# Patient Record
Sex: Male | Born: 1975 | Race: Black or African American | Hispanic: No | Marital: Single | Smoking: Never smoker
Health system: Southern US, Community
[De-identification: ages and names within clinical notes are randomized; demographics above are authoritative.]

---

## 2016-05-18 ENCOUNTER — Emergency Department (HOSPITAL_COMMUNITY)
Admission: EM | Admit: 2016-05-18 | Discharge: 2016-05-18 | Disposition: A | Discharge status: Home / Self Care | Payer: No Typology Code available for payment source | Attending: Emergency Medicine | Admitting: Emergency Medicine

## 2016-05-18 ENCOUNTER — Emergency Department (HOSPITAL_COMMUNITY): Payer: No Typology Code available for payment source

## 2016-05-18 ENCOUNTER — Encounter (HOSPITAL_COMMUNITY): Payer: Self-pay | Admitting: Emergency Medicine

## 2016-05-18 DIAGNOSIS — Y92410 Unspecified street and highway as the place of occurrence of the external cause: Secondary | ICD-10-CM | POA: Diagnosis not present

## 2016-05-18 DIAGNOSIS — Y939 Activity, unspecified: Secondary | ICD-10-CM | POA: Diagnosis not present

## 2016-05-18 DIAGNOSIS — S2231XA Fracture of one rib, right side, initial encounter for closed fracture: Secondary | ICD-10-CM | POA: Diagnosis not present

## 2016-05-18 DIAGNOSIS — Y999 Unspecified external cause status: Secondary | ICD-10-CM | POA: Diagnosis not present

## 2016-05-18 DIAGNOSIS — S299XXA Unspecified injury of thorax, initial encounter: Secondary | ICD-10-CM | POA: Diagnosis present

## 2016-05-18 MED ORDER — METHOCARBAMOL 500 MG PO TABS: ORAL_TABLET | ORAL | 0 refills | Status: DC

## 2016-05-18 MED ORDER — IBUPROFEN 800 MG PO TABS
800.0000 mg | ORAL_TABLET | Freq: Once | ORAL | Status: AC
  Administered 2016-05-18: 800 mg via ORAL
  Filled 2016-05-18: qty 4

## 2016-05-18 MED ORDER — HYDROCODONE-ACETAMINOPHEN 5-325 MG PO TABS: ORAL_TABLET | ORAL | 0 refills | Status: DC
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
## 2016-05-18 NOTE — Discharge Instructions (Signed)
Take percocet for breakthrough pain, do not drink alcohol, drive, care for children or do other critical tasks while taking percocet.  ° °It is very important that you take deep breaths to prevent lung collapse and infection. ° °Either use your incentive spirometer or take 10 deep breaths every hour to prevent lung collapse. ° °If you develop cough, fever or shortness of breath return immediately to the emergency room.  ° ° °Please follow with your primary care doctor in the next 2 days for a check-up. They must obtain records for further management.  ° °Do not hesitate to return to the Emergency Department for any new, worsening or concerning symptoms.  ° °

## 2016-05-18 NOTE — ED Notes (Signed)
RT at bedside. Patient education on incentive spirometry. Pt verbalizes and demonstrates understanding.
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
## 2016-05-23 ENCOUNTER — Ambulatory Visit (INDEPENDENT_AMBULATORY_CARE_PROVIDER_SITE_OTHER): Payer: Managed Care, Other (non HMO) | Admitting: Emergency Medicine

## 2016-05-23 ENCOUNTER — Ambulatory Visit (INDEPENDENT_AMBULATORY_CARE_PROVIDER_SITE_OTHER): Payer: Managed Care, Other (non HMO)

## 2016-05-23 VITALS — BP 114/62 | HR 60 | Temp 98.6°F | Resp 16 | Ht 73.0 in | Wt 209.0 lb

## 2016-05-23 DIAGNOSIS — S2231XD Fracture of one rib, right side, subsequent encounter for fracture with routine healing: Secondary | ICD-10-CM

## 2016-05-23 DIAGNOSIS — S301XXA Contusion of abdominal wall, initial encounter: Secondary | ICD-10-CM | POA: Diagnosis not present

## 2016-05-23 LAB — POCT URINALYSIS DIP (MANUAL ENTRY)
BILIRUBIN UA: NEGATIVE
GLUCOSE UA: NEGATIVE
Ketones, POC UA: NEGATIVE
Leukocytes, UA: NEGATIVE
NITRITE UA: NEGATIVE
Protein Ur, POC: NEGATIVE
RBC UA: NEGATIVE
Spec Grav, UA: 1.02
Urobilinogen, UA: 0.2
pH, UA: 6
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
## 2016-05-23 NOTE — Patient Instructions (Addendum)
IF you received an x-ray today, you will receive an invoice from [facility]. Please contact [facility] at [phone] with questions or concerns regarding your invoice.   IF you received labwork today, you will receive an invoice from [employer]. Please contact [facility] at [phone] with questions or concerns regarding your invoice.   Our billing staff will not be able to assist you with questions regarding bills from these companies.  You will be contacted with the lab results as soon as they are available. The fastest way to get your results is to activate your My Chart account. Instructions are located on the last page of this paperwork. If you have not heard from [place] regarding the results in 2 weeks, please contact this office.     Rib fracture Rib Fracture A rib fracture is a break or crack in one of the bones of the ribs. The ribs are a group of long, curved bones that wrap around your chest and attach to your spine. They protect your lungs and other organs in the chest cavity. A broken or cracked rib is often painful, but most do not cause other problems. Most rib fractures heal on their own over time. However, rib fractures can be more serious if multiple ribs are broken or if broken ribs move out of place and push against other structures. What are the causes?  A direct blow to the chest. For example, this could happen during contact sports, a car accident, or a fall against a hard object.  Repetitive movements with high force, such as pitching a baseball or having severe coughing spells. What are the signs or symptoms?  Pain when you breathe in or cough.  Pain when someone presses on the injured area. How is this diagnosed? Your caregiver will perform a physical exam. Various imaging tests may be ordered to confirm the diagnosis and to look for related injuries. These tests may include a chest X-ray, computed tomography (CT),  magnetic resonance imaging (MRI), or a bone scan. How is this treated? Rib fractures usually heal on their own in 1-3 months. The longer healing period is often associated with a continued cough or other aggravating activities. During the healing period, pain control is very important. Medication is usually given to control pain. Hospitalization or surgery may be needed for more severe injuries, such as those in which multiple ribs are broken or the ribs have moved out of place. Follow these instructions at home:  Avoid strenuous activity and any activities or movements that cause pain. Be careful during activities and avoid bumping the injured rib.  Gradually increase activity as directed by your caregiver.  Only take over-the-counter or prescription medications as directed by your caregiver. Do not take other medications without asking your caregiver first.  Apply ice to the injured area for the first 1-2 days after you have been treated or as directed by your caregiver. Applying ice helps to reduce inflammation and pain.  Put ice in a plastic bag.  Place a towel between your skin and the bag.  Leave the ice on for 15-20 minutes at a time, every 2 hours while you are awake.  Perform deep breathing as directed by your caregiver. This will help prevent pneumonia, which is a common complication of a broken rib. Your caregiver may instruct you to:  Take deep breaths several times a day.  Try to cough several times a day, holding a pillow against the injured area.  Use a device called an incentive spirometer to practice deep breathing several times a day.  Drink enough fluids to keep your urine clear or pale yellow. This will help you avoid constipation.  Do not wear a rib belt or binder. These restrict breathing, which can lead to pneumonia. Get help right away if:  You have a fever.  You have difficulty breathing or shortness of breath.  You develop a continual cough, or you cough  up thick or bloody sputum.  You feel sick to your stomach (nausea), throw up (vomit), or have abdominal pain.  You have worsening pain not controlled with medications. This information is not intended to replace advice given to you by your health care provider. Make sure you discuss any questions you have with your health care provider. Document Released: 06/18/2005 Document Revised: 11/24/2015 Document Reviewed: 08/20/2012 Elsevier Interactive Patient Education  2017 [place]. Lipoma Introduction A lipoma is a noncancerous (benign) tumor that is made up of fat cells. This is a very common type of soft-tissue growth. Lipomas are usually found under the skin (subcutaneous). They may occur in any tissue of the body that contains fat. Common areas for lipomas to appear include the back, shoulders, buttocks, and thighs. Lipomas grow slowly, and they are usually painless. Most lipomas do not cause problems and do not require treatment. What are the causes? The cause of this condition is not known. What increases the risk? This condition is more likely to develop in:  People who are 13-48 years old.  People who have a family history of lipomas. What are the signs or symptoms? A lipoma usually appears as a small, round bump under the skin. It may feel soft or rubbery, but the firmness can vary. Most lipomas are not painful. However, a lipoma may become painful if it is located in an area where it pushes on nerves. How is this diagnosed? A lipoma can usually be diagnosed with a physical exam. You may also have tests to confirm the diagnosis and to rule out other conditions. Tests may include:  Imaging tests, such as a CT scan or MRI.  Removal of a tissue sample to be looked at under a microscope (biopsy). How is this treated? Treatment is not needed for small lipomas that are not causing problems. If a lipoma continues to get bigger or it causes problems, removal is often the best option.  Lipomas can also be removed to improve appearance. Removal of a lipoma is usually done with a surgery in which the fatty cells and the surrounding capsule are removed. Most often, a medicine that numbs the area (local anesthetic) is used for this procedure. Follow these instructions at home:  Keep all follow-up visits as directed by your health care provider. This is important. Contact a health care provider if:  Your lipoma becomes larger or hard.  Your lipoma becomes painful, red, or increasingly swollen. These could be signs of infection or a more serious condition. This information is not intended to replace advice given to you by your health care provider. Make sure you discuss any questions you have with your health care provider. Document Released: 06/08/2002 Document Revised: 11/24/2015 Document Reviewed: 06/14/2014  2017 Elsevier

## 2016-05-23 NOTE — Progress Notes (Signed)
Yellow °Clear ° °

## 2017-04-25 ENCOUNTER — Emergency Department (HOSPITAL_COMMUNITY): Payer: Managed Care, Other (non HMO)

## 2017-04-25 ENCOUNTER — Encounter (HOSPITAL_COMMUNITY): Payer: Self-pay

## 2017-04-25 ENCOUNTER — Emergency Department (HOSPITAL_COMMUNITY)
Admission: EM | Admit: 2017-04-25 | Discharge: 2017-04-25 | Disposition: A | Discharge status: Home / Self Care | Payer: Managed Care, Other (non HMO) | Attending: Emergency Medicine | Admitting: Emergency Medicine

## 2017-04-25 DIAGNOSIS — S199XXA Unspecified injury of neck, initial encounter: Secondary | ICD-10-CM | POA: Diagnosis present

## 2017-04-25 DIAGNOSIS — Y999 Unspecified external cause status: Secondary | ICD-10-CM | POA: Insufficient documentation

## 2017-04-25 DIAGNOSIS — Y939 Activity, unspecified: Secondary | ICD-10-CM | POA: Insufficient documentation

## 2017-04-25 DIAGNOSIS — S161XXA Strain of muscle, fascia and tendon at neck level, initial encounter: Secondary | ICD-10-CM | POA: Insufficient documentation

## 2017-04-25 DIAGNOSIS — S0081XA Abrasion of other part of head, initial encounter: Secondary | ICD-10-CM | POA: Insufficient documentation

## 2017-04-25 DIAGNOSIS — Y92410 Unspecified street and highway as the place of occurrence of the external cause: Secondary | ICD-10-CM | POA: Diagnosis not present

## 2017-04-25 DIAGNOSIS — M25561 Pain in right knee: Secondary | ICD-10-CM | POA: Insufficient documentation

## 2017-04-25 DIAGNOSIS — Z79899 Other long term (current) drug therapy: Secondary | ICD-10-CM | POA: Insufficient documentation

## 2017-04-25 MED ORDER — LIDOCAINE 5 % EX PTCH: 1.0000 | MEDICATED_PATCH | CUTANEOUS | 0 refills | Status: DC

## 2017-04-25 MED ORDER — IBUPROFEN 600 MG PO TABS: 600.0000 mg | ORAL_TABLET | Freq: Four times a day (QID) | ORAL | 0 refills | Status: DC | PRN

## 2017-04-25 MED ORDER — METHOCARBAMOL 500 MG PO TABS: 500.0000 mg | ORAL_TABLET | Freq: Two times a day (BID) | ORAL | 0 refills | Status: DC

## 2017-04-25 NOTE — ED Notes (Signed)
Patient transported to X-ray/ct 
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
## 2017-04-25 NOTE — Discharge Instructions (Signed)
Expect your soreness to increase over the next 2-3 days. Take it easy, but do not lay around too much as this may make any stiffness worse.  Antiinflammatory medications: Take 600 mg of ibuprofen every 6 hours or 440 mg (over the counter dose) to 500 mg (prescription dose) of naproxen every 12 hours for the next 3 days. After this time, these medications may be used as needed for pain. Take these medications with food to avoid upset stomach. Choose only one of these medications, do not take them together.  Tylenol: Should you continue to have additional pain while taking the ibuprofen or naproxen, you may add in tylenol as needed. Your daily total maximum amount of tylenol from all sources should be limited to 4000mg /day for persons without liver problems, or 2000mg /day for those with liver problems. Muscle relaxer: Robaxin is a muscle relaxer and may help loosen stiff muscles. Do not take the Robaxin while driving or performing other dangerous activities.  Lidocaine patches: These are available via either prescription or over-the-counter. The over-the-counter option may be more economical one and are likely just as effective. There are multiple over-the-counter brands, such as Salonpas. Exercises: Be sure to perform the attached exercises starting with three times a week and working up to performing them daily. This is an essential part of preventing long term problems.   Follow up with a primary care provider for any future management of these complaints.   Head Injury You have been seen today for a head injury. It does not appear to be serious at this time.  Close observation: The close observation period is usually 6 hours from the injury. This includes staying awake and having a trustworthy adult monitor you to assure your condition does not worsen. You should be in regular contact with this person and ideally, they should be able to monitor you in person.  Secondary observation: The secondary  observation period is usually 24 hours from the injury. You are allowed to sleep during this time. A trustworthy adult should intermittently monitor you to assure your condition does not worsen.   Overall head injury/concussion care: Rest: Be sure to get plenty of rest. You will need more rest and sleep while you recover. Hydration: Be sure to stay well hydrated by having a goal of drinking about 0.5 liters of water an hour. Pain:  Antiinflammatory medications: Take 600 mg of ibuprofen every 6 hours or 440 mg (over the counter dose) to 500 mg (prescription dose) of naproxen every 12 hours or for the next 3 days. After this time, these medications may be used as needed for pain. Take these medications with food to avoid upset stomach. Choose only one of these medications, do not take them together. Tylenol: Should you continue to have additional pain while taking the ibuprofen or naproxen, you may add in tylenol as needed. Your daily total maximum amount of tylenol from all sources should be limited to 4000mg /day for persons without liver problems, or 2000mg /day for those with liver problems. Return to sports and activities: In general, you may return to normal activities once symptoms have subsided, however, you would ideally be cleared by a primary care provider or other qualified medical professional prior to return to these activities.  Follow up: Follow up with the concussion clinic or your primary care provider for further management of this issue. Return: Return to the ED should any symptoms worsen.
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
## 2017-09-02 ENCOUNTER — Ambulatory Visit: Payer: Managed Care, Other (non HMO) | Admitting: Neurology

## 2017-09-02 ENCOUNTER — Encounter: Payer: Self-pay | Admitting: Neurology

## 2017-09-02 VITALS — BP 113/60 | HR 57 | Ht 73.0 in | Wt 215.5 lb

## 2017-09-02 DIAGNOSIS — R519 Headache, unspecified: Secondary | ICD-10-CM

## 2017-09-02 DIAGNOSIS — M542 Cervicalgia: Secondary | ICD-10-CM

## 2017-09-02 DIAGNOSIS — R51 Headache: Secondary | ICD-10-CM | POA: Diagnosis not present

## 2017-09-02 MED ORDER — NORTRIPTYLINE HCL 25 MG PO CAPS: 50.0000 mg | ORAL_CAPSULE | Freq: Every day | ORAL | 11 refills | Status: DC
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
## 2017-09-03 ENCOUNTER — Encounter: Payer: Self-pay | Admitting: Neurology

## 2017-09-03 DIAGNOSIS — M542 Cervicalgia: Secondary | ICD-10-CM | POA: Insufficient documentation

## 2017-09-18 ENCOUNTER — Ambulatory Visit: Payer: Managed Care, Other (non HMO) | Attending: Neurology | Admitting: Physical Therapy

## 2017-09-18 ENCOUNTER — Encounter: Payer: Self-pay | Admitting: Physical Therapy

## 2017-09-18 DIAGNOSIS — R252 Cramp and spasm: Secondary | ICD-10-CM | POA: Diagnosis present

## 2017-09-18 DIAGNOSIS — M542 Cervicalgia: Secondary | ICD-10-CM | POA: Diagnosis not present

## 2017-09-18 DIAGNOSIS — R293 Abnormal posture: Secondary | ICD-10-CM | POA: Diagnosis present

## 2017-09-18 NOTE — Patient Instructions (Addendum)
Flexibility: Upper Trapezius Stretch    Gently grasp right side of head while reaching behind back with other hand. Tilt head away until a gentle stretch is felt. Hold _30___ seconds. Repeat __2-3__ times per set. Do __1__ sets per session. Do __2-3__ sessions per day.   Levator Stretch    Grasp seat or sit on hand on side to be stretched. Turn head toward other side and look down. Use hand on head to gently stretch neck in that position. Hold __30__ seconds. Repeat on other side. Repeat _2-3__ times. Do __1-2__ sessions per day.     TheraCane ([employer] ~ $25)      Trigger Point Dry Needling  . What is Trigger Point Dry Needling (DN)? o DN is a physical therapy technique used to treat muscle pain and dysfunction. Specifically, DN helps deactivate muscle trigger points (muscle knots).  o A thin filiform needle is used to penetrate the skin and stimulate the underlying trigger point. The goal is for a local twitch response (LTR) to occur and for the trigger point to relax. No medication of any kind is injected during the procedure.   . What Does Trigger Point Dry Needling Feel Like?  o The procedure feels different for each individual patient. Some patients report that they do not actually feel the needle enter the skin and overall the process is not painful. Very mild bleeding may occur. However, many patients feel a deep cramping in the muscle in which the needle was inserted. This is the local twitch response.   Marland Kitchen How Will I feel after the treatment? o Soreness is normal, and the onset of soreness may not occur for a few hours. Typically this soreness does not last longer than two days.  o Bruising is uncommon, however; ice can be used to decrease any possible bruising.  o In rare cases feeling tired or nauseous after the treatment is normal. In addition, your symptoms may get worse before they get better, this period will typically not last longer than 24 hours.   . What Can I  do After My Treatment? o Increase your hydration by drinking more water for the next 24 hours. o You may place ice or heat on the areas treated that have become sore, however, do not use heat on inflamed or bruised areas. Heat often brings more relief post needling. o You can continue your regular activities, but vigorous activity is not recommended initially after the treatment for 24 hours. o DN is best combined with other physical therapy such as strengthening, stretching, and other therapies.
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
## 2017-09-26 ENCOUNTER — Encounter: Payer: Self-pay | Admitting: Physical Therapy

## 2017-09-26 ENCOUNTER — Ambulatory Visit: Payer: Managed Care, Other (non HMO) | Admitting: Physical Therapy

## 2017-09-26 DIAGNOSIS — R293 Abnormal posture: Secondary | ICD-10-CM

## 2017-09-26 DIAGNOSIS — R252 Cramp and spasm: Secondary | ICD-10-CM

## 2017-09-26 DIAGNOSIS — M542 Cervicalgia: Secondary | ICD-10-CM

## 2017-09-26 NOTE — Patient Instructions (Signed)
Posture Tips DO: - stand tall and erect - keep chin tucked in - keep head and shoulders in alignment - check posture regularly in mirror or large window - pull head back against headrest in car seat;  Change your position often.  Sit with lumbar support. DON'T: - slouch or slump while watching TV or reading - sit, stand or lie in one position  for too long;  Sitting is especially hard on the spine so if you sit at a desk/use the computer, then stand up often!   Copyright  VHI. All rights reserved.  Posture - Standing   Good posture is important. Avoid slouching and forward head thrust. Maintain curve in low back and align ears over shoul- ders, hips over ankles.  Pull your belly button in toward your back bone. Even weight on the ball of your foot and heel. Soft knees,  Ribs lifted up ( golden thread from the sternum to the sky) Chin down  Copyright  VHI. All rights reserved.  Posture - Sitting   Sit upright, head facing forward. Try using a roll to support lower back. Keep shoulders relaxed, and avoid rounded back. Keep hips level with knees. Avoid crossing legs for long periods. Sit on sit bones not tailbone.    Copyright  VHI. All rights reserved.  Flexors, Supine    Lie on back, head on small, rolled towel. Tip chin down. Tighten muscles in back of throat. Hold _10__ seconds. Repeat 10___ times per session. Do _1-2__ sessions per day.  Copyright  VHI. All rights reserved.  Extension: Trunk Supported - Prone    Lie with trunk on bench, neck bent forward. Tuck chin in and lift head, straightening neck. Keep chin down toward chest Repeat __15__ times per set. Do _2___ sets per session. Do __1-2__ sessions per week.   Copyright  VHI. All rights reserved.   Voncille Lo, PT Certified Exercise Expert for the Aging Adult  09/26/17 12:06 PM Phone: [phone] Fax: [phone]

## 2017-09-26 NOTE — Therapy (Signed)
[facility] [place], [place], [address] Phone: [phone]   Fax:  [phone]  Physical Therapy Treatment  Patient Details  Name: Marcus Mckinney MRN: [id number] Date of Birth: 04-19-1976 Referring Provider: Marcial Pacas, MD   Encounter Date: 09/26/2017  PT End of Session - 09/26/17 1242    Visit Number  2    Number of Visits  12    Date for PT Re-Evaluation  10/30/17    Authorization Type  Aetna    PT Start Time  1150    PT Stop Time  1240    PT Time Calculation (min)  50 min    Activity Tolerance  Patient tolerated treatment well    Behavior During Therapy  [facility] for tasks assessed/performed       History reviewed. No pertinent past medical history.  History reviewed. No pertinent surgical history.  There were no vitals filed for this visit.  Subjective Assessment - 09/26/17 1155    Subjective  Pt states his neck feels stiff and tight. Not sore to touch.  Ready for dry needling    Currently in Pain?  Yes    Pain Score  0-No pain tightness not pain not sore to touch.          [facility] PT Assessment - 09/26/17 1236      Functional Tests   Functional tests  Other      Other:   Other/ Comments  supine deep neck flexor hold.  15 sec,  ( normal 35 seconds)            No data recorded       [facility] Adult PT Treatment/Exercise - 09/26/17 1233      Self-Care   Self-Care  Posture    Other Self-Care Comments   Proper sitting and standing posture being aware of forward neck      Neck Exercises: Prone   Other Prone Exercise  prone neck lift 10 x 10 sec hold      Modalities   Modalities  Moist Heat      Moist Heat Therapy   Number Minutes Moist Heat  10 Minutes    Moist Heat Location  Cervical      Manual Therapy   Manual Therapy  Joint mobilization;Soft tissue mobilization    Joint Mobilization  lateral UPA Right and then left grade 4 for C-2 to C5    Soft tissue mobilization  scalenes,  upper trap and levator and cervcial paraspinals bil      Neck Exercises: Stretches   Upper Trapezius Stretch  2 reps;30 seconds    Upper Trapezius Stretch Limitations  1 right and 1 left for review    Levator Stretch  2 reps;30 seconds    Levator Stretch Limitations  1 R and 1 left for review    Other Neck Stretches  supine deep neck flexor with towel  and added stretch 10 x 10 sec hold.        Trigger Point Dry Needling - 09/26/17 1207    Consent Given?  Yes    Education Handout Provided  Yes verbally and given handout last visit    Muscles Treated Upper Body  Upper trapezius;Levator scapulae;Suboccipitals muscle group;Oblique capitus scalenes bil for all TPDN    Upper Trapezius Response  Twitch reponse elicited;Palpable increased muscle length    Oblique Capitus Response  Palpable increased muscle length    SubOccipitals Response  Palpable increased muscle length  Levator Scapulae Response  Twitch response elicited;Palpable increased muscle length           PT Education - 09/26/17 1206    Education provided  Yes    Education Details  discussed reticular activation system and posture sitting and standing and added neck strengthening/stretching.    Person(s) Educated  Patient    Methods  Explanation;Demonstration;Tactile cues;Verbal cues;Handout    Comprehension  Verbalized understanding;Returned demonstration          PT Long Term Goals - 09/18/17 1352      PT LONG TERM GOAL #1   Title  independent with HEP    Status  New    Target Date  10/30/17      PT LONG TERM GOAL #2   Title  verbalize understanding of postural awareness to help with symptoms and pain    Status  New    Target Date  10/30/17      PT LONG TERM GOAL #3   Title  report pain < 4/10 with work activities for improved function and decreased pain    Status  New    Target Date  10/30/17      PT LONG TERM GOAL #4   Title  improve cervical ROM to WNL for improved function and ADLs    Status  New     Target Date  10/30/17            Plan - 09/26/17 1238    Clinical Impression Statement  [name] presents to clinic for 2nd visit and request TPDN.  Pt was given verbal reinforcement of education last session.  Pt consented and was closely monitored throughout session. Pt with marked twitch response of bil upper traps. . Pt also educatied on posture and added additional stregnth and stretch to neck HEP    Rehab Potential  Good    PT Frequency  2x / week    PT Duration  6 weeks    PT Treatment/Interventions  ADLs/Self Care Home Management;Cryotherapy;Electrical Stimulation;Ultrasound;Traction;Moist Heat;Functional mobility training;Therapeutic activities;Therapeutic exercise;Patient/family education;Manual techniques;Dry needling;Taping;Passive range of motion    PT Next Visit Plan  review stretches, DN/manual/modalities PRN, posture exercises    PT Home Exercise Plan  Upper trap, Deep neck flexor ,levator, prone neck strength    Consulted and Agree with Plan of Care  Patient       Patient will benefit from skilled therapeutic intervention in order to improve the following deficits and impairments:  Increased fascial restricitons, Increased muscle spasms, Pain, Postural dysfunction, Decreased range of motion, Impaired flexibility  Visit Diagnosis: Cervicalgia  Cramp and spasm  Abnormal posture     Problem List Patient Active Problem List   Diagnosis Date Noted  . Neck pain 09/03/2017  . Nonintractable headache 09/02/2017    Voncille Lo, PT Certified Exercise Expert for the Aging Adult  09/26/17 12:45 PM Phone: [phone] Fax: [facility] [address] [place], [place], [address] Phone: [phone]   Fax:  [phone]  Name: Marcus Mckinney MRN: [id number] Date of Birth: [birth year]

## 2017-10-01 ENCOUNTER — Encounter: Payer: Self-pay | Admitting: Physical Therapy

## 2017-10-01 ENCOUNTER — Ambulatory Visit: Payer: Managed Care, Other (non HMO) | Attending: Neurology | Admitting: Physical Therapy

## 2017-10-01 DIAGNOSIS — R293 Abnormal posture: Secondary | ICD-10-CM | POA: Insufficient documentation

## 2017-10-01 DIAGNOSIS — R252 Cramp and spasm: Secondary | ICD-10-CM | POA: Diagnosis present

## 2017-10-01 DIAGNOSIS — M542 Cervicalgia: Secondary | ICD-10-CM | POA: Insufficient documentation

## 2017-10-01 NOTE — Therapy (Signed)
[facility] [place], [place], [address] Phone: [phone]   Fax:  [phone]  Physical Therapy Treatment  Patient Details  Name: Marcus Mckinney MRN: [id number] Date of Birth: 11/04/1975 Referring Provider: Marcial Pacas, MD   Encounter Date: 10/01/2017  PT End of Session - 10/01/17 1636    Visit Number  3    Number of Visits  12    Date for PT Re-Evaluation  10/30/17    PT Start Time  3710    PT Stop Time  1724    PT Time Calculation (min)  49 min    Activity Tolerance  Patient tolerated treatment well    Behavior During Therapy  [facility] for tasks assessed/performed       History reviewed. No pertinent past medical history.  History reviewed. No pertinent surgical history.  There were no vitals filed for this visit.  Subjective Assessment - 10/01/17 1636    Subjective  " after the last session the DN helped I was sore, but it helped. I still have biggest problem sleeping"     Currently in Pain?  Yes    Pain Score  5     Pain Orientation  Right;Left    Pain Descriptors / Indicators  Tightness;Spasm    Pain Type  Chronic pain    Pain Onset  More than a month ago    Pain Frequency  Intermittent    Aggravating Factors   head movements    Pain Relieving Factors  rest                       [facility] Adult PT Treatment/Exercise - 10/01/17 1729      Neck Exercises: Standing   Other Standing Exercises  lower trap wall Y's with lift of 1 x 8 pt couldn't tolerate more than 8      Neck Exercises: Seated   Other Seated Exercise  self first rib mobs grade with focus on deep breathing techniques.       Neck Exercises: Supine   Other Supine Exercise  chin tuck head lift 5 x 15 sec hold      Manual Therapy   Manual Therapy  Taping    Manual therapy comments  skilled palpation and monitoring pt throughout TPDN    Joint Mobilization  PA of T1-T8 , bil first rib mobs grade 3 with pt breathing in/out    Soft tissue mobilization  IASTM in bil upper traps/ levator scapulae    McConnell  R upper trap inhibition taping      Neck Exercises: Stretches   Upper Trapezius Stretch  2 reps;30 seconds    Levator Stretch  2 reps;30 seconds       Trigger Point Dry Needling - 10/01/17 1709    Consent Given?  Yes    Education Handout Provided  No given previously    Upper Trapezius Response  Twitch reponse elicited;Palpable increased muscle length    Levator Scapulae Response  Twitch response elicited;Palpable increased muscle length                PT Long Term Goals - 09/18/17 1352      PT LONG TERM GOAL #1   Title  independent with HEP    Status  New    Target Date  10/30/17      PT LONG TERM GOAL #2   Title  verbalize understanding of postural awareness to help with symptoms and  pain    Status  New    Target Date  10/30/17      PT LONG TERM GOAL #3   Title  report pain < 4/10 with work activities for improved function and decreased pain    Status  New    Target Date  10/30/17      PT LONG TERM GOAL #4   Title  improve cervical ROM to WNL for improved function and ADLs    Status  New    Target Date  10/30/17            Plan - 10/01/17 1733    Clinical Impression Statement  pt reported significant relief of pain and tightness since the last session but conitnued to have pain in bil upper traps. Continued TPDN with bil Upper trap and levator scapulae followed with IASTM and thoracic and rib mobs. trialed inhibition taping and strengthening of the lower trap. end of session he reported no pain but soreness from DN, pt declined modalities.     PT Treatment/Interventions  ADLs/Self Care Home Management;Cryotherapy;Electrical Stimulation;Ultrasound;Traction;Moist Heat;Functional mobility training;Therapeutic activities;Therapeutic exercise;Patient/family education;Manual techniques;Dry needling;Taping;Passive range of motion    PT Next Visit Plan  review stretches,  DN/manual/modalities PRN, posture exercises, how was tapin.     PT Home Exercise Plan  Upper trap, Deep neck flexor ,levator, prone neck strength    Consulted and Agree with Plan of Care  Patient       Patient will benefit from skilled therapeutic intervention in order to improve the following deficits and impairments:     Visit Diagnosis: Cervicalgia  Cramp and spasm  Abnormal posture     Problem List Patient Active Problem List   Diagnosis Date Noted  . Neck pain 09/03/2017  . Nonintractable headache 09/02/2017   Starr [place] PT, DPT, LAT, ATC  10/01/17  5:36 PM      [facility] [address] [place], [place], [address] Phone: [phone]   Fax:  [phone]  Name: Marcus Mckinney MRN: [id number] Date of Birth: [birth year]

## 2017-10-03 ENCOUNTER — Ambulatory Visit: Payer: Managed Care, Other (non HMO) | Admitting: Physical Therapy

## 2017-10-03 ENCOUNTER — Encounter: Payer: Self-pay | Admitting: Physical Therapy

## 2017-10-03 DIAGNOSIS — R293 Abnormal posture: Secondary | ICD-10-CM

## 2017-10-03 DIAGNOSIS — M542 Cervicalgia: Secondary | ICD-10-CM | POA: Diagnosis not present

## 2017-10-03 DIAGNOSIS — R252 Cramp and spasm: Secondary | ICD-10-CM

## 2017-10-03 NOTE — Therapy (Signed)
[place], [place], [address] Phone: [phone]   Fax:  [phone]  Physical Therapy Treatment  Patient Details  Name: Marcus Mckinney MRN: [id number] Date of Birth: 01/16/1976 Referring Provider: Marcial Pacas, MD   Encounter Date: 10/03/2017  PT End of Session - 10/03/17 1602    Visit Number  4    Number of Visits  12    Date for PT Re-Evaluation  10/30/17    Authorization Type  Aetna    PT Start Time  1450    Activity Tolerance  Patient tolerated treatment well    Behavior During Therapy  [facility] for tasks assessed/performed       History reviewed. No pertinent past medical history.  History reviewed. No pertinent surgical history.  There were no vitals filed for this visit.  Subjective Assessment - 10/03/17 1603    Subjective  My traps caught onto the needle last time and then it loosened.   I have trouble sleeping at night and knowing what to do to make pain better    Patient Stated Goals  improve pain and tightness    Currently in Pain?  Yes    Pain Score  4     Pain Location  Neck    Pain Orientation  Right;Left    Pain Descriptors / Indicators  Tightness;Spasm    Pain Onset  More than a month ago    Pain Frequency  Intermittent         [facility] PT Assessment - 10/03/17 1605      AROM   Cervical Flexion  55    Cervical Extension  35    Cervical - Right Side Bend  27    Cervical - Left Side Bend  25    Cervical - Right Rotation  50    Cervical - Left Rotation  60                   [facility] Adult PT Treatment/Exercise - 10/03/17 1400      Self-Care   Self-Care  Lifting;ADL's;Posture    ADL's  used handout to work on household chores and sleeping positions for comfort/ no reading in bed    Lifting  educated on principles and had pt demonstrate lifting stool    Posture  reviewed posture for sitting and standing and transitional movements      Neck Exercises: Standing    Other Standing Exercises  lower trap wall y's x 10   pt states he has been working at it at gym      [place]    Manual therapy comments  skilled palpation and monitoring pt throughout TPDN    Joint Mobilization  PA C-2 to t-3, grade 3/4     Soft tissue mobilization  soft tissue Upper traps and levator and cervical paraspinals    McConnell  R upper trap inhibition taping       Trigger Point Dry Needling - 10/03/17 1618    Consent Given?  Yes    Education Handout Provided  No given previously    Muscles Treated Upper Body  Upper trapezius;Levator scapulae left c-2/c-4 twitch    Upper Trapezius Response  Twitch reponse elicited;Palpable increased muscle length    Oblique Capitus Response  Palpable increased muscle length    SubOccipitals Response  Palpable increased muscle length    Levator Scapulae Response  Twitch response elicited  PT Education - 10/03/17 1602    Education provided  Yes    Education Details  Pt educated on posture and body mechanics and demo lifting of stool.  and verbalized 5 strategies to protect back and neck    Person(s) Educated  Patient    Methods  Explanation;Demonstration;Tactile cues;Verbal cues;Handout    Comprehension  Verbalized understanding;Returned demonstration          PT Long Term Goals - 10/03/17 1551      PT LONG TERM GOAL #1   Title  independent with HEP    Baseline  Pt given HEP     Time  6    Period  Weeks    Status  On-going      PT LONG TERM GOAL #2   Title  verbalize understanding of postural awareness to help with symptoms and pain    Baseline  Pt educated with handout and return demo and was able to verbalize 5 strategies immediately    Time  6    Period  Weeks    Status  On-going      PT LONG TERM GOAL #3   Title  report pain < 4/10 with work activities for improved function and decreased pain    Baseline  Pt reported 4/10 pain today    Time  6    Period  Weeks    Status   On-going      PT LONG TERM GOAL #4   Title  improve cervical ROM to WNL for improved function and ADLs    Baseline  Pt with improved AROM cervical Flex 55, ext 35, LSB 27,rsb 25 and right rot 50 and left rot 60    Time  6    Period  Weeks    Status  On-going            Plan - 10/03/17 1643    Clinical Impression Statement  Pt reported working on lower trap exercises and it is very challenging even for a weight lifter. Pt liked the taping from previous session.  He reports 4/10 pain today improved from eval.  Pt has made gaind in AROM of cervical  rotation R 50, left 60 flex 55, ext 35, R SB 27 and LSB 25,  Pt is making good progress to complete allgoals.  Pt was educated in [employer] and ADL's and sleeping postures for pain releif.  Pt consented to TPDN and was closely monitored throughout session. with increased AROM cervical.    Rehab Potential  Good    PT Frequency  2x / week    PT Duration  6 weeks    PT Treatment/Interventions  ADLs/Self Care Home Management;Cryotherapy;Electrical Stimulation;Ultrasound;Traction;Moist Heat;Functional mobility training;Therapeutic activities;Therapeutic exercise;Patient/family education;Manual techniques;Dry needling;Taping;Passive range of motion    PT Next Visit Plan  review stretches, DN/manual/modalities PRN, posture exercises, how was tapin.        Patient will benefit from skilled therapeutic intervention in order to improve the following deficits and impairments:  Increased fascial restricitons, Increased muscle spasms, Pain, Postural dysfunction, Decreased range of motion, Impaired flexibility  Visit Diagnosis: Cervicalgia  Cramp and spasm  Abnormal posture     Problem List Patient Active Problem List   Diagnosis Date Noted  . Neck pain 09/03/2017  . Nonintractable headache 09/02/2017    Voncille Lo, PT Certified Exercise Expert for the Aging Adult  10/03/17 4:47 PM Phone: [phone] Fax:  [phone]  [place],  [place], [address] Phone: [phone]   Fax:  [phone]  Name: Marcus Mckinney MRN: [id number] Date of Birth: 1976-03-14

## 2017-10-03 NOTE — Patient Instructions (Signed)

## 2017-10-08 ENCOUNTER — Ambulatory Visit: Payer: Managed Care, Other (non HMO) | Admitting: Physical Therapy

## 2017-10-10 ENCOUNTER — Ambulatory Visit: Payer: Managed Care, Other (non HMO) | Admitting: Physical Therapy

## 2017-10-10 ENCOUNTER — Encounter: Payer: Self-pay | Admitting: Physical Therapy

## 2017-10-10 DIAGNOSIS — M542 Cervicalgia: Secondary | ICD-10-CM

## 2017-10-10 DIAGNOSIS — R252 Cramp and spasm: Secondary | ICD-10-CM

## 2017-10-10 DIAGNOSIS — R293 Abnormal posture: Secondary | ICD-10-CM

## 2017-10-10 NOTE — Therapy (Signed)
[place], [place], [address] Phone: [phone]   Fax:  [phone]  Physical Therapy Treatment  Patient Details  Name: Marcus Mckinney MRN: [id number] Date of Birth: 1976-02-22 Referring Provider: Marcial Pacas, MD   Encounter Date: 10/10/2017  PT End of Session - 10/10/17 1507    Visit Number  5    Number of Visits  12    Date for PT Re-Evaluation  10/30/17    Authorization Type  Aetna    PT Start Time  1505    PT Stop Time  1552    PT Time Calculation (min)  47 min    Activity Tolerance  Patient tolerated treatment well    Behavior During Therapy  [facility] for tasks assessed/performed       History reviewed. No pertinent past medical history.  History reviewed. No pertinent surgical history.  There were no vitals filed for this visit.  Subjective Assessment - 10/10/17 1506    Subjective  "everything seems to be getting better, I can twist alittle more"     Currently in Pain?  Yes    Pain Score  3     Pain Orientation  Right;Left    Pain Onset  More than a month ago    Pain Frequency  Intermittent    Aggravating Factors   head moving looking up/ down                       [facility] Adult PT Treatment/Exercise - 10/10/17 1520      Neck Exercises: Supine   Other Supine Exercise  thoracic extension when with hands behined head and elbow tucked    Other Supine Exercise  chin tuck head lift 5 x 15 sec hold      Neck Exercises: Prone   Other Prone Exercise   book opening 2 x 10 bil      Moist Heat Therapy   Number Minutes Moist Heat  8 Minutes    Moist Heat Location  Cervical supine      Manual Therapy   Manual Therapy  Taping    Manual therapy comments  skilled palpation and monitoring pt throughout TPDN    Joint Mobilization  PA C-2 to t-3, grade 3/4     Soft tissue mobilization  soft tissue Upper traps and levator and cervical paraspinals    McConnell   bil upper trap  inhibition taping      Neck Exercises: Stretches   Upper Trapezius Stretch  2 reps;30 seconds    Levator Stretch  2 reps;30 seconds    Other Neck Stretches  rhomboid stretching from door 2 x 10       Trigger Point Dry Needling - 10/10/17 1519    Consent Given?  Yes    Education Handout Provided  No given previously    Upper Trapezius Response  Twitch reponse elicited;Palpable increased muscle length    Oblique Capitus Response  Twitch response elicited;Palpable increased muscle length    SubOccipitals Response  Twitch response elicited;Palpable increased muscle length    Levator Scapulae Response  Twitch response elicited;Palpable increased muscle length           PT Education - 10/10/17 1627    Education provided  Yes    Education Details  updated HEP for thoracic mobility.     Person(s) Educated  Patient    Methods  Explanation;Verbal cues    Comprehension  Verbalized understanding;Verbal cues  required          PT Long Term Goals - 10/03/17 1551      PT LONG TERM GOAL #1   Title  independent with HEP    Baseline  Pt given HEP     Time  6    Period  Weeks    Status  On-going      PT LONG TERM GOAL #2   Title  verbalize understanding of postural awareness to help with symptoms and pain    Baseline  Pt educated with handout and return demo and was able to verbalize 5 strategies immediately    Time  6    Period  Weeks    Status  On-going      PT LONG TERM GOAL #3   Title  report pain < 4/10 with work activities for improved function and decreased pain    Baseline  Pt reported 4/10 pain today    Time  6    Period  Weeks    Status  On-going      PT LONG TERM GOAL #4   Title  improve cervical ROM to WNL for improved function and ADLs    Baseline  Pt with improved AROM cervical Flex 55, ext 35, LSB 27,rsb 25 and right rot 50 and left rot 60    Time  6    Period  Weeks    Status  On-going            Plan - 10/10/17 1629    Clinical Impression  Statement  pt reports improved improvement since the last session. Continued TPDN which he continues to report relief of muscle tensoin and pain. Soft tissue work for [employer] and thoracic mobility which he performed well and reported decreased pain end of session. continued MHP end of session to calm down soreness.     PT Next Visit Plan  review stretches, DN/manual/modalities PRN, posture exercises, how was tapin.     PT Home Exercise Plan  Upper trap, Deep neck flexor ,levator, prone neck strength, book opening    Consulted and Agree with Plan of Care  Patient       Patient will benefit from skilled therapeutic intervention in order to improve the following deficits and impairments:  Increased fascial restricitons, Increased muscle spasms, Pain, Postural dysfunction, Decreased range of motion, Impaired flexibility  Visit Diagnosis: Cervicalgia  Cramp and spasm  Abnormal posture     Problem List Patient Active Problem List   Diagnosis Date Noted  . Neck pain 09/03/2017  . Nonintractable headache 09/02/2017   Starr Lake PT, DPT, LAT, ATC  10/10/17  4:34 PM      [facility] [address] [place], [place], [address] Phone: [phone]   Fax:  [phone]  Name: Marcus Mckinney MRN: [id number] Date of Birth: [birth year]

## 2017-10-15 ENCOUNTER — Encounter: Payer: Self-pay | Admitting: Physical Therapy

## 2017-10-15 ENCOUNTER — Ambulatory Visit: Payer: Managed Care, Other (non HMO) | Admitting: Physical Therapy

## 2017-10-15 DIAGNOSIS — M542 Cervicalgia: Secondary | ICD-10-CM | POA: Diagnosis not present

## 2017-10-15 DIAGNOSIS — R293 Abnormal posture: Secondary | ICD-10-CM

## 2017-10-15 DIAGNOSIS — R252 Cramp and spasm: Secondary | ICD-10-CM

## 2017-10-15 NOTE — Therapy (Signed)
[place], [place], [address] Phone: [phone]   Fax:  [phone]  Physical Therapy Treatment  Patient Details  Name: Marcus Mckinney MRN: [id number] Date of Birth: May 24, 1976 Referring Provider: Marcial Pacas, MD   Encounter Date: 10/15/2017  PT End of Session - 10/15/17 1649    Visit Number  6    Number of Visits  12    Date for PT Re-Evaluation  10/30/17    Authorization Type  Aetna    PT Start Time  3235 pt arrived 18 min late    PT Stop Time  1716    PT Time Calculation (min)  28 min    Activity Tolerance  Patient tolerated treatment well    Behavior During Therapy  [facility] for tasks assessed/performed       History reviewed. No pertinent past medical history.  History reviewed. No pertinent surgical history.  There were no vitals filed for this visit.  Subjective Assessment - 10/15/17 1651    Subjective  "I've been doing pretty good, some stiffness with extreme end ranges"     Currently in Pain?  Yes    Pain Score  3     Pain Location  Neck    Pain Orientation  Right;Left    Pain Descriptors / Indicators  Tightness                       [facility] Adult PT Treatment/Exercise - 10/15/17 1658      Self-Care   Other Self-Care Comments   MTPR using theracane and length of use       Neck Exercises: Supine   Other Supine Exercise  foam roll routine: ceiling punches, horizontal abd/add, alternating ceiling punches, snow angels, and X     Other Supine Exercise  chin tuck head lift 5 x 15 sec hold      Neck Exercises: Stretches   Upper Trapezius Stretch  2 reps;30 seconds    Levator Stretch  2 reps;30 seconds             PT Education - 10/15/17 1733    Education provided  Yes    Education Details  MTPR using theracane and where he can find a theracane    Person(s) Educated  Patient    Methods  Explanation;Verbal cues;Handout    Comprehension  Verbalized  understanding;Verbal cues required          PT Long Term Goals - 10/03/17 1551      PT LONG TERM GOAL #1   Title  independent with HEP    Baseline  Pt given HEP     Time  6    Period  Weeks    Status  On-going      PT LONG TERM GOAL #2   Title  verbalize understanding of postural awareness to help with symptoms and pain    Baseline  Pt educated with handout and return demo and was able to verbalize 5 strategies immediately    Time  6    Period  Weeks    Status  On-going      PT LONG TERM GOAL #3   Title  report pain < 4/10 with work activities for improved function and decreased pain    Baseline  Pt reported 4/10 pain today    Time  6    Period  Weeks    Status  On-going      PT LONG  TERM GOAL #4   Title  improve cervical ROM to WNL for improved function and ADLs    Baseline  Pt with improved AROM cervical Flex 55, ext 35, LSB 27,rsb 25 and right rot 50 and left rot 60    Time  6    Period  Weeks    Status  On-going            Plan - 10/15/17 1734    Clinical Impression Statement  pt was 18 min to today's session. Due to pt running late opted to hold off on DN and utilize MTPR and educate how to perform at home. utilized foam roll routine for scapulothoracic mobility which he reported relief of pain and tightness. end of session he reported no pain and declined modalites. If pt continues to progress expect discharge in the next 1-2 visits.     PT Treatment/Interventions  ADLs/Self Care Home Management;Cryotherapy;Electrical Stimulation;Ultrasound;Traction;Moist Heat;Functional mobility training;Therapeutic activities;Therapeutic exercise;Patient/family education;Manual techniques;Dry needling;Taping;Passive range of motion    PT Next Visit Plan  review stretches, DN/manual/modalities PRN, posture exercises, scapular strengthening    PT Home Exercise Plan  Upper trap, Deep neck flexor ,levator, prone neck strength, book opening    Consulted and Agree with Plan of Care   Patient       Patient will benefit from skilled therapeutic intervention in order to improve the following deficits and impairments:  Increased fascial restricitons, Increased muscle spasms, Pain, Postural dysfunction, Decreased range of motion, Impaired flexibility  Visit Diagnosis: Cervicalgia  Cramp and spasm  Abnormal posture     Problem List Patient Active Problem List   Diagnosis Date Noted  . Neck pain 09/03/2017  . Nonintractable headache 09/02/2017   Starr Lake PT, DPT, LAT, ATC  10/15/17  5:36 PM      [facility] [address] [place], [place], [address] Phone: [phone]   Fax:  [phone]  Name: Marcus Mckinney MRN: [id number] Date of Birth: 05/04/1976

## 2017-10-17 ENCOUNTER — Encounter: Payer: Self-pay | Admitting: Physical Therapy

## 2017-10-17 ENCOUNTER — Ambulatory Visit: Payer: Managed Care, Other (non HMO) | Admitting: Physical Therapy

## 2017-10-17 DIAGNOSIS — M542 Cervicalgia: Secondary | ICD-10-CM

## 2017-10-17 DIAGNOSIS — R293 Abnormal posture: Secondary | ICD-10-CM

## 2017-10-17 DIAGNOSIS — R252 Cramp and spasm: Secondary | ICD-10-CM

## 2017-10-17 NOTE — Therapy (Signed)
[place], [place], [address] Phone: [phone]   Fax:  [phone]  Physical Therapy Treatment / Discharge summary   Patient Details  Name: Marcus Mckinney MRN: [id number] Date of Birth: 1976-02-09 Referring Provider: Marcial Pacas, MD   Encounter Date: 10/17/2017  PT End of Session - 10/17/17 1507    Visit Number  7    Number of Visits  12    Date for PT Re-Evaluation  10/30/17    Authorization Type  Aetna    PT Start Time  1508 pt arrived 7 min late    PT Stop Time  1538 shortened session due to d/C    PT Time Calculation (min)  30 min    Activity Tolerance  Patient tolerated treatment well    Behavior During Therapy  [facility] for tasks assessed/performed       History reviewed. No pertinent past medical history.  History reviewed. No pertinent surgical history.  There were no vitals filed for this visit.  Subjective Assessment - 10/17/17 1507    Subjective  "I am doing pretty good"     Currently in Pain?  No/denies    Pain Location  Neck    Pain Orientation  Right;Left    Pain Descriptors / Indicators  Tightness    Pain Type  Chronic pain         [facility] PT Assessment - 10/17/17 1525      AROM   Cervical Flexion  56 inital 32    Cervical Extension  42 initally 27    Cervical - Right Side Bend  40 inital 25 degrees    Cervical - Left Side Bend  40 initally 16 degrees    Cervical - Right Rotation  71 initally 42    Cervical - Left Rotation  66 initally 43                   [facility] Adult PT Treatment/Exercise - 10/17/17 1511      Neck Exercises: Machines for Strengthening   UBE (Upper Arm Bike)  L3 x 8 min  changing direction at 4 min      Neck Exercises: Stretches   Upper Trapezius Stretch  2 reps;30 seconds    Levator Stretch  2 reps;30 seconds    Other Neck Stretches  rhomboid stretching from door 2 x 10             PT Education - 10/17/17 1539    Education  provided  Yes    Education Details  reviewed previously provided HEP, benefits of continued strengthening to promote endurnace traninig. updated HEP for foam roll routine.     Person(s) Educated  Patient    Methods  Explanation;Verbal cues;Handout    Comprehension  Verbalized understanding;Verbal cues required          PT Long Term Goals - 10/17/17 1530      PT LONG TERM GOAL #1   Title  independent with HEP    Time  6    Period  Weeks    Status  Achieved      PT LONG TERM GOAL #2   Title  verbalize understanding of postural awareness to help with symptoms and pain    Time  6    Period  Weeks    Status  Achieved      PT LONG TERM GOAL #3   Title  report pain < 4/10 with work activities for improved  function and decreased pain    Time  6    Period  Weeks    Status  Achieved      PT LONG TERM GOAL #4   Title  improve cervical ROM to WNL for improved function and ADLs    Time  6    Period  Weeks    Status  Achieved            Plan - 10/17/17 1540    Clinical Impression Statement  pt has made great progress with physical therapy with increased ROM, and additionally reports no pain. He is able to do all exercises with no reprot of pain or discomfort. pt met all goals today.  He is able to maintain and progress his current level of function independently and no longer requires physical therapy.     PT Next Visit Plan  D/C    PT Home Exercise Plan  Upper trap, Deep neck flexor ,levator, prone neck strength, book opening    Consulted and Agree with Plan of Care  Patient       Patient will benefit from skilled therapeutic intervention in order to improve the following deficits and impairments:  Increased fascial restricitons, Increased muscle spasms, Pain, Postural dysfunction, Decreased range of motion, Impaired flexibility  Visit Diagnosis: Cervicalgia  Cramp and spasm  Abnormal posture     Problem List Patient Active Problem List   Diagnosis Date Noted  .  Neck pain 09/03/2017  . Nonintractable headache 09/02/2017   Starr Lake PT, DPT, LAT, ATC  10/17/17  3:44 PM      [facility] [address] [place], [place], [address] Phone: [phone]   Fax:  [phone]  Name: CHIRON CAMPIONE MRN: [id number] Date of Birth: 07/19/1975        PHYSICAL THERAPY DISCHARGE SUMMARY  Visits from Start of Care: 7  Current functional level related to goals / functional outcomes: See goals   Remaining deficits: See above assessment   Education / Equipment: HEP, theraband, posture, anatomy    Plan: Patient agrees to discharge.  Patient goals were met. Patient is being discharged due to being pleased with the current functional level.  ?????          Kelis Plasse PT, DPT, LAT, ATC  10/17/17  3:44 PM

## 2017-10-22 ENCOUNTER — Ambulatory Visit: Payer: Managed Care, Other (non HMO) | Admitting: Physical Therapy

## 2017-10-24 ENCOUNTER — Encounter: Payer: Managed Care, Other (non HMO) | Admitting: Physical Therapy
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
## 2019-12-10 ENCOUNTER — Encounter: Payer: Self-pay | Admitting: Plastic Surgery

## 2019-12-10 ENCOUNTER — Ambulatory Visit (INDEPENDENT_AMBULATORY_CARE_PROVIDER_SITE_OTHER): Payer: 59 | Admitting: Plastic Surgery

## 2019-12-10 ENCOUNTER — Other Ambulatory Visit: Payer: Self-pay

## 2019-12-10 VITALS — BP 104/72 | HR 68 | Temp 98.4°F | Ht 73.0 in | Wt 218.0 lb

## 2019-12-10 DIAGNOSIS — D170 Benign lipomatous neoplasm of skin and subcutaneous tissue of head, face and neck: Secondary | ICD-10-CM | POA: Diagnosis not present

## 2019-12-10 NOTE — Progress Notes (Signed)
   Referring Provider No referring provider defined for this encounter.   CC:  Chief Complaint  Patient presents with  . Advice Only      Marcus Mckinney is an 44 y.o. male.  HPI: Patient presents with a several year history of a mass on his posterior neck.  It is intermittently painful.  He thinks it might be growing.  He wants to be excised if possible.  No Known Allergies  Outpatient Encounter Medications as of 12/10/2019  Medication Sig  . HYDROcodone-acetaminophen (NORCO/VICODIN) 5-325 MG tablet Take 1-2 tablets by mouth every 6 hours as needed for pain.  Marland Kitchen ibuprofen (ADVIL,MOTRIN) 600 MG tablet Take 1 tablet (600 mg total) by mouth every 6 (six) hours as needed.  . lidocaine (LIDODERM) 5 % Place 1 patch onto the skin daily. Remove & Discard patch within 12 hours or as directed by MD  . methocarbamol (ROBAXIN) 500 MG tablet Take 1 tablet (500 mg total) by mouth 2 (two) times daily.  . nortriptyline (PAMELOR) 25 MG capsule Take 2 capsules (50 mg total) by mouth at bedtime.   No facility-administered encounter medications on file as of 12/10/2019.     No past medical history on file.  No past surgical history on file.  No family history on file.  Social History   Social History Narrative  . Not on file     Review of Systems General: Denies fevers, chills, weight loss CV: Denies chest pain, shortness of breath, palpitations  Physical Exam Vitals with BMI 12/10/2019 09/02/2017 04/25/2017  Height 6\' 1"  6\' 1"  -  Weight 218 lbs 215 lbs 8 oz -  BMI 39.53 20.23 -  Systolic 343 568 616  Diastolic 72 60 72  Pulse 68 57 61    General:  No acute distress,  Alert and oriented, Non-Toxic, Normal speech and affect Examination shows a 3 cm subcutaneous mass on his posterior neck.  It is freely mobile.  It does not appear fixed.  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  We discussed excision.  We discussed the risk that include  bleeding, infection, damage to surrounding structures, need for additional procedures.  We discussed doing this under local and in the operating room and he prefers local.  I think this is reasonable.  We will plan to get this scheduled for him soon.  Cindra Presume 12/10/2019, 11:06 AM

## 2020-01-21 ENCOUNTER — Encounter: Payer: Self-pay | Admitting: Plastic Surgery

## 2020-01-21 ENCOUNTER — Ambulatory Visit: Payer: 59 | Admitting: Plastic Surgery

## 2020-01-21 ENCOUNTER — Inpatient Hospital Stay (HOSPITAL_COMMUNITY): Admit: 2020-01-21 | Payer: 59

## 2020-01-21 ENCOUNTER — Other Ambulatory Visit: Payer: Self-pay

## 2020-01-21 ENCOUNTER — Other Ambulatory Visit (HOSPITAL_COMMUNITY)
Admission: RE | Admit: 2020-01-21 | Discharge: 2020-01-21 | Disposition: A | Discharge status: Home / Self Care | Payer: 59 | Source: Ambulatory Visit | Attending: Plastic Surgery | Admitting: Plastic Surgery

## 2020-01-21 VITALS — BP 130/76 | HR 66 | Temp 98.2°F

## 2020-01-21 DIAGNOSIS — D170 Benign lipomatous neoplasm of skin and subcutaneous tissue of head, face and neck: Secondary | ICD-10-CM | POA: Diagnosis present

## 2020-01-21 NOTE — Progress Notes (Signed)
Operative Note   DATE OF OPERATION: 01/21/2020  LOCATION:    SURGICAL DEPARTMENT: Plastic Surgery  PREOPERATIVE DIAGNOSES: Posterior neck mass  POSTOPERATIVE DIAGNOSES:  same  PROCEDURE:  1. Excision of subcutaneous posterior neck lipoma measuring 3.5 cm 2. Complex closure measuring 3.5 cm  SURGEON: Talmadge Coventry, MD  ANESTHESIA:  Local  COMPLICATIONS: None.   INDICATIONS FOR PROCEDURE:  The patient, Marcus Mckinney is a 44 y.o. male born on 1975/12/13, is here for treatment of posterior neck lipoma MRN: [id number]  CONSENT:  Informed consent was obtained directly from the patient. Risks, benefits and alternatives were fully discussed. Specific risks including but not limited to bleeding, infection, hematoma, seroma, scarring, pain, infection, wound healing problems, and need for further surgery were all discussed. The patient did have an ample opportunity to have questions answered to satisfaction.   DESCRIPTION OF PROCEDURE:  Local anesthesia was administered. The patient's operative site was prepped and draped in a sterile fashion. A time out was performed and all information was confirmed to be correct.  The lesion was excised with a 15 blade.  Hemostasis was obtained.  Circumferential undermining was performed and the skin was advanced and closed in layers with interrupted buried Monocryl sutures and Monocryl for the skin.  The lesion excised measured 3.5 cm, and the total length of closure measured 3.5 cm.    The patient tolerated the procedure well.  There were no complications.

## 2020-01-22 LAB — SURGICAL PATHOLOGY

## 2020-02-04 ENCOUNTER — Other Ambulatory Visit: Payer: Self-pay

## 2020-02-04 ENCOUNTER — Encounter: Payer: Self-pay | Admitting: Surgical

## 2020-02-04 ENCOUNTER — Ambulatory Visit (INDEPENDENT_AMBULATORY_CARE_PROVIDER_SITE_OTHER): Payer: 59 | Admitting: Surgical

## 2020-02-04 VITALS — BP 119/72 | HR 70 | Temp 98.4°F

## 2020-02-04 DIAGNOSIS — D170 Benign lipomatous neoplasm of skin and subcutaneous tissue of head, face and neck: Secondary | ICD-10-CM

## 2020-02-04 NOTE — Progress Notes (Signed)
Patient is a 44 year old male here for follow-up after excision of lipoma of posterior neck on 01/21/2020 with Dr. Claudia Desanctis.  Patient reports he is doing well.  He reports she has had some itching in this area.  He has been applying Band-Aids while at work to protect it.  Pathology report reviewed with patient.  Lipoma was noted on pathology.  On exam the incision is C/D/I, no periincisional erythema.  Monocryl sutures noted.  No dehiscence noted.  No drainage noted.  Minimal tenderness to palpation.  Slight overlapping of skin edges at the central portion of the incision, overall appears to be healing well.  Marcus Mckinney is doing well, recommend avoiding any over extension or flexion of his neck for at least another week to decrease risk of dehiscence.  Recommend applying Vaseline daily for a few days.  Monocryl suture knots were removed.  Call with any questions or concerns, follow-up as needed. Pictures were obtained of the patient and placed in the chart with the patient's or guardian's permission.
